# Patient Record
Sex: Female | Born: 1975 | Race: White | Hispanic: No | Marital: Married | Smoking: Never smoker
Health system: Southern US, Community
[De-identification: ages and names within clinical notes are randomized; demographics above are authoritative.]

## PROBLEM LIST (undated history)

## (undated) DIAGNOSIS — N189 Chronic kidney disease, unspecified: Secondary | ICD-10-CM

## (undated) HISTORY — DX: Chronic kidney disease, unspecified: N18.9

## (undated) HISTORY — PX: BREAST EXCISIONAL BIOPSY: SUR124

## (undated) HISTORY — PX: CERVICAL ABLATION: SHX5771

---

## 2000-03-28 ENCOUNTER — Ambulatory Visit (HOSPITAL_COMMUNITY): Admission: RE | Admit: 2000-03-28 | Discharge: 2000-03-28 | Payer: Self-pay | Admitting: General Surgery

## 2000-03-28 ENCOUNTER — Encounter (INDEPENDENT_AMBULATORY_CARE_PROVIDER_SITE_OTHER): Payer: Self-pay

## 2003-07-09 ENCOUNTER — Encounter: Payer: Self-pay | Admitting: Emergency Medicine

## 2003-07-09 ENCOUNTER — Emergency Department (HOSPITAL_COMMUNITY): Admission: EM | Admit: 2003-07-09 | Discharge: 2003-07-09 | Payer: Self-pay | Admitting: Emergency Medicine

## 2006-03-14 ENCOUNTER — Inpatient Hospital Stay (HOSPITAL_COMMUNITY): Admission: AD | Admit: 2006-03-14 | Discharge: 2006-03-17 | Payer: Self-pay | Admitting: Obstetrics and Gynecology

## 2006-03-14 ENCOUNTER — Encounter (INDEPENDENT_AMBULATORY_CARE_PROVIDER_SITE_OTHER): Payer: Self-pay | Admitting: Specialist

## 2006-04-29 ENCOUNTER — Other Ambulatory Visit: Admission: RE | Admit: 2006-04-29 | Discharge: 2006-04-29 | Payer: Self-pay | Admitting: Obstetrics and Gynecology

## 2008-07-10 ENCOUNTER — Inpatient Hospital Stay (HOSPITAL_COMMUNITY): Admission: AD | Admit: 2008-07-10 | Discharge: 2008-07-11 | Payer: Self-pay | Admitting: Obstetrics and Gynecology

## 2008-07-10 ENCOUNTER — Encounter (INDEPENDENT_AMBULATORY_CARE_PROVIDER_SITE_OTHER): Payer: Self-pay | Admitting: Obstetrics and Gynecology

## 2010-04-11 ENCOUNTER — Other Ambulatory Visit: Admission: RE | Admit: 2010-04-11 | Discharge: 2010-04-11 | Payer: Self-pay | Admitting: Family Medicine

## 2011-01-05 ENCOUNTER — Observation Stay (HOSPITAL_COMMUNITY)
Admission: AD | Admit: 2011-01-05 | Discharge: 2011-01-08 | Disposition: A | Discharge status: Home / Self Care | Payer: PRIVATE HEALTH INSURANCE | Source: Ambulatory Visit | Attending: Obstetrics and Gynecology | Admitting: Obstetrics and Gynecology

## 2011-01-05 DIAGNOSIS — D72829 Elevated white blood cell count, unspecified: Secondary | ICD-10-CM | POA: Insufficient documentation

## 2011-01-05 DIAGNOSIS — N949 Unspecified condition associated with female genital organs and menstrual cycle: Secondary | ICD-10-CM | POA: Insufficient documentation

## 2011-01-05 DIAGNOSIS — O368390 Maternal care for abnormalities of the fetal heart rate or rhythm, unspecified trimester, not applicable or unspecified: Secondary | ICD-10-CM | POA: Insufficient documentation

## 2011-01-05 DIAGNOSIS — R509 Fever, unspecified: Secondary | ICD-10-CM

## 2011-01-05 DIAGNOSIS — O99891 Other specified diseases and conditions complicating pregnancy: Principal | ICD-10-CM | POA: Insufficient documentation

## 2011-01-05 LAB — COMPREHENSIVE METABOLIC PANEL
Alkaline Phosphatase: 116 U/L (ref 39–117)
BUN: 9 mg/dL (ref 6–23)
Chloride: 103 mEq/L (ref 96–112)
GFR calc non Af Amer: >60 mL/min (ref >60)
Glucose, Bld: 127 mg/dL — ABNORMAL HIGH (ref 70–99)
Potassium: 3.5 mEq/L (ref 3.5–5.1)
Total Bilirubin: 1 mg/dL (ref 0.3–1.2)

## 2011-01-05 LAB — URINALYSIS, ROUTINE W REFLEX MICROSCOPIC
Ketones, ur: 40 mg/dL — AB
Protein, ur: 100 mg/dL — AB
Specific Gravity, Urine: >1.03 — ABNORMAL HIGH (ref 1.005–1.030)
Urine Glucose, Fasting: NEGATIVE mg/dL
pH: 5.5 (ref 5.0–8.0)

## 2011-01-05 LAB — URINE MICROSCOPIC-ADD ON

## 2011-01-06 ENCOUNTER — Inpatient Hospital Stay (HOSPITAL_COMMUNITY): Payer: PRIVATE HEALTH INSURANCE

## 2011-01-06 LAB — CBC
HCT: 26.6 % — ABNORMAL LOW (ref 36.0–46.0)
MCHC: 33.5 g/dL (ref 30.0–36.0)
MCV: 87.4 fL (ref 78.0–100.0)
MCV: 87.5 fL (ref 78.0–100.0)
Platelets: 175 10*3/uL (ref 150–400)
RDW: 13.5 % (ref 11.5–15.5)
WBC: 25.2 10*3/uL — ABNORMAL HIGH (ref 4.0–10.5)
WBC: 17.4 10*3/uL — ABNORMAL HIGH (ref 4.0–10.5)

## 2011-01-06 LAB — DIFFERENTIAL
Basophils Relative: 0 % (ref 0–1)
Blasts: 0 %
Eosinophils Relative: 0 % (ref 0–5)
Lymphocytes Relative: 1 % — ABNORMAL LOW (ref 12–46)
Lymphocytes Relative: 5 % — ABNORMAL LOW (ref 12–46)
Lymphs Abs: 0.3 10*3/uL — ABNORMAL LOW (ref 0.7–4.0)
Lymphs Abs: 0.8 10*3/uL (ref 0.7–4.0)
Metamyelocytes Relative: 0 %
Monocytes Absolute: 0.8 10*3/uL (ref 0.1–1.0)
Myelocytes: 0 %
Promyelocytes Absolute: 0 %
nRBC: 0 /100 WBC

## 2011-01-06 LAB — URINALYSIS, ROUTINE W REFLEX MICROSCOPIC
Leukocytes, UA: NEGATIVE
Protein, ur: NEGATIVE mg/dL
Urobilinogen, UA: 0.2 mg/dL (ref 0.0–1.0)

## 2011-01-06 LAB — URINE MICROSCOPIC-ADD ON

## 2011-01-07 LAB — CBC
HCT: 27.4 % — ABNORMAL LOW (ref 36.0–46.0)
MCH: 29.3 pg (ref 26.0–34.0)
MCHC: 33.2 g/dL (ref 30.0–36.0)
MCV: 88.1 fL (ref 78.0–100.0)
RDW: 13.9 % (ref 11.5–15.5)

## 2011-01-07 LAB — DIFFERENTIAL
Basophils Absolute: 0 10*3/uL (ref 0.0–0.1)
Eosinophils Relative: 0 % (ref 0–5)
Lymphocytes Relative: 7 % — ABNORMAL LOW (ref 12–46)
Monocytes Absolute: 0.8 10*3/uL (ref 0.1–1.0)
Monocytes Relative: 7 % (ref 3–12)

## 2011-01-07 LAB — URINE CULTURE

## 2011-01-08 ENCOUNTER — Inpatient Hospital Stay (HOSPITAL_COMMUNITY)
Admit: 2011-01-08 | Discharge: 2011-01-08 | Disposition: A | Discharge status: Home / Self Care | Payer: PRIVATE HEALTH INSURANCE | Attending: Obstetrics and Gynecology | Admitting: Obstetrics and Gynecology

## 2011-01-08 LAB — DIFFERENTIAL
Lymphocytes Relative: 16 % (ref 12–46)
Lymphs Abs: 1.3 10*3/uL (ref 0.7–4.0)
Monocytes Relative: 11 % (ref 3–12)
Neutro Abs: 6 10*3/uL (ref 1.7–7.7)
Neutrophils Relative %: 72 % (ref 43–77)

## 2011-01-08 LAB — CBC
Hemoglobin: 8.7 g/dL — ABNORMAL LOW (ref 12.0–15.0)
MCH: 29.1 pg (ref 26.0–34.0)
MCV: 88 fL (ref 78.0–100.0)
RBC: 2.99 MIL/uL — ABNORMAL LOW (ref 3.87–5.11)
WBC: 8.4 10*3/uL (ref 4.0–10.5)

## 2011-01-09 NOTE — Discharge Summary (Signed)
  NAMEANGELEAH, Phyllis [name]                  ACCOUNT NO.:  [id number]  MEDICAL RECORD NO.:  [id number]           PATIENT TYPE:  I  LOCATION:  9156                          [facility]ILITY:  [facility]  PHYSICIAN:  Malachi Pro. Ambrose Mantle, M.D. DATE OF BIRTH:  Mar 25, 1976  DATE OF ADMISSION:  01/05/2011 DATE OF DISCHARGE:  01/08/2011                              DISCHARGE SUMMARY   A 35 year old white female para 1-1-0-2, gravida 3 at 35 weeks and 3 days admitted with fever of unknown origin.  She did have an ultrasound to do an amnio, but no fluid was seen.  She had a fever to 103.2, I consulted with the Maternal Fetal Medicine at [facility] and they recommended treating her with gentamicin and ampicillin; and with that regimen, she became afebrile, has been afebrile now for 36 hours.  Her fetal tachycardia has resolved.  There is a reactive nonstress test. Her white count has gone from 25,000 to 108,000 and I have consulted again with Dr. Otho Perl in [facility] Fetal Medicine and he agrees to discharge with close followup.  She is to return in 4 days.  He does not want her discharged on antibiotics.  Because the patient did have a slight cough, a  chest x-ray was done and it was normal.  Cath urine was negative.  FINAL DIAGNOSES:  Intrauterine pregnancy at 35 weeks and 3 days with fever of unknown origin, leukocytosis, and pelvic pressure.  OPERATIONS PERFORMED:  None.  FINAL CONDITION:  Improved.  INSTRUCTIONS:  Calling with any fever above 100 degrees.  Call with any unusual problems.  Return to the office on Friday, which is January 12, 2011, for office visit and nonstress test.     Malachi Pro. Ambrose Mantle, M.D.     TFH/MEDQ  D:  01/08/2011  T:  01/08/2011  Job:  [id number]  Electronically Signed by Tracey Harries M.D. on 01/09/2011 09:10:47 AM

## 2011-01-09 NOTE — H&P (Signed)
Phyllis [name], Phyllis [name]                  ACCOUNT NO.:  [id number]  MEDICAL RECORD NO.:  [id number]           PATIENT TYPE:  I  LOCATION:  [facility]                         [facility]  PHYSICIAN:  Malachi Pro. Ambrose Mantle, M.D. DATE OF BIRTH:  August 09, 1976  DATE OF ADMISSION:  01/05/2011 DATE OF DISCHARGE:                             HISTORY & PHYSICAL   HISTORY OF PRESENT ILLNESS:  A 35 year old white female, para 1-1-0-2, who has a last period of May 03, 2010, and [facility] of February 07, 2011, by her last period.  Ultrasound on July 06, 2010, showed an estimated gestational age of [redacted] weeks and 6 days with an [facility] of February 09, 2011. Her official [facility] is February 07, 2011.  The patient had a relatively uncomplicated prenatal course.  Blood group and type O+ with a negative antibody, rubella immune, RPR nonreactive.  Urine culture show group B strep, hepatitis B surface antigen negative, HIV negative, GC and Chlamydia negative.  Cystic fibrosis was declined.  First trimester screening was declined.  1-hour Glucola was 127.  HIV and RPR repeats were negative.  The patient's 2 prior pregnancies resulted in delivery at 38 weeks and 4 days and 36 weeks.  The first pregnancy at 38 weeks and 4 days, the patient became febrile.  The etiology of the fever could not be determined and since there was a risk of chorioamnionitis with a favorable cervix, the patient was induced and had a healthy baby.  The pathology on the placenta showed no chorioamnionitis.  Her second delivery was at 36 weeks when she presented with almost the exact same clinical picture, fever of uncertain origin, but she did go into labor and her cervix changed from 2 cm to 4 cm and she was subsequently delivered of a living infant that spent time in the NICU with respiratory problems.  The patient's prenatal course this time was relatively uncomplicated until January 04, 2011, when she was in the office she was examined and subsequently she states  she had intense pelvic pressure that did not abate.  She was able to sleep through the night, but again awakening on January 05, 2011, she had pelvic pressure, became nauseated, vomited x1, had a temperature elevation to 100.1, came to the office and was examined.  A nonstress test was reactive.  Her cervix had not changed and she was sent home.  Later in the evening of January 05, 2011, she developed a fever to 103.2 and came to the Maternity Admission Unit for evaluation.  Cath urine showed no infection.  The urinalysis was negative.  She had no significant uterine contractions.  Her temperature was not as high as it had been at home.  The uterus was not tender.  White count was 25,000, otherwise her labs were reasonably normal.  PHYSICAL EXAMINATION:  GENERAL:  A well-developed, well-nourished white female, uncomfortable, whose temperature ranged from 100.5-99.1 and pulse rate from 156-127. HEAD, EYES, EARS, NOSE and THROAT:  Normal. LUNGS:  Clear to auscultation. HEART:  Normal size and sounds, but the rate is elevated.ABDOMEN:  Soft.  The fundal height is consistent  with dates and the uterus is not tender.  When her uterus is palpated, it does cause pressure in the pelvis, but no significant tenderness of the abdomen. The cervix is not reexamined since she is not contracting.  IMPRESSION:  Intrauterine pregnancy at 35 weeks and 3 days with leukocytosis and pelvic pressure.  I have spoken to Dr. Otho Perl from [facility] and he advised [place] an amniocentesis to look for signs of chorioamnionitis.  He also requests that we get a chest x-ray, which can be done after the amniocentesis.  The patient will be admitted to the hospital and await the results of the amniocentesis if they can be successfully accomplished.     Malachi Pro. Ambrose Mantle, M.D.     TFH/MEDQ  D:  01/06/2011  T:  01/06/2011  Job:  [id number]  Electronically Signed by Tracey Harries M.D.  on 01/09/2011 09:10:32 AM

## 2011-01-12 LAB — CULTURE, BLOOD (ROUTINE X 2)
Culture  Setup Time: 201202250357
Culture: NO GROWTH

## 2011-02-07 ENCOUNTER — Inpatient Hospital Stay (HOSPITAL_COMMUNITY): Admission: AD | Admit: 2011-02-07 | Payer: Self-pay | Source: Home / Self Care | Admitting: Obstetrics and Gynecology

## 2011-02-12 ENCOUNTER — Inpatient Hospital Stay (HOSPITAL_COMMUNITY)
Admission: RE | Admit: 2011-02-12 | Discharge: 2011-02-14 | DRG: 775 | Disposition: A | Discharge status: Home / Self Care | Payer: PRIVATE HEALTH INSURANCE | Source: Ambulatory Visit | Attending: Obstetrics and Gynecology | Admitting: Obstetrics and Gynecology

## 2011-02-12 DIAGNOSIS — Z22330 Carrier of Group B streptococcus: Secondary | ICD-10-CM

## 2011-02-12 DIAGNOSIS — O99892 Other specified diseases and conditions complicating childbirth: Secondary | ICD-10-CM | POA: Diagnosis present

## 2011-02-12 LAB — CBC
HCT: 33.3 % — ABNORMAL LOW (ref 36.0–46.0)
Hemoglobin: 10.7 g/dL — ABNORMAL LOW (ref 12.0–15.0)
MCHC: 32.1 g/dL (ref 30.0–36.0)
MCV: 86.9 fL (ref 78.0–100.0)
RDW: 14.5 % (ref 11.5–15.5)
WBC: 8.1 10*3/uL (ref 4.0–10.5)

## 2011-02-16 NOTE — Discharge Summary (Signed)
  [name], [name]                  ACCOUNT NO.:  [id number]  MEDICAL RECORD NO.:  [id number]           PATIENT TYPE:  I  LOCATION:  9120                          [facility]ACILITY:  [facility]  PHYSICIAN:  Zenaida Niece, M.D.DATE OF BIRTH:  1976-06-07  DATE OF ADMISSION:  02/12/2011 DATE OF DISCHARGE:  02/14/2011                              DISCHARGE SUMMARY   ADMISSION DIAGNOSES:  Intrauterine pregnancy at 40 weeks, group B strep carrier.  DISCHARGE DIAGNOSES:  Intrauterine pregnancy at 40 weeks, group B strep carrier.  PROCEDURES:  On April 2, she had a spontaneous vaginal delivery.  HISTORY AND PHYSICAL:  This is a 35 year old gravida 3, para 1-1-0-2 with an EGA of 40 plus weeks who presents for elective induction. Prenatal care complicated by the fact that she received weekly 17 progesterone shots for history of 36-week delivery and was admitted at 35 weeks for rule out labor/rule out chorioamnionitis.  SIGNIFICANT PRENATAL LABS:  Blood type is O+ with negative antibody screen, rubella immune, 1-hour Glucola 127, group B strep is positive.  PAST OB HISTORY:  In 2007 vaginal delivery at 38 weeks, 6 pounds 13 ounces complicated by possible chorioamnionitis or a fever of unknown origin and in 2009 vaginal delivery at 36 weeks, 6 pounds 9 ounces. Also had fever and tachycardia.  PAST SURGICAL HISTORY:  Right breast lumpectomy.  PHYSICAL EXAMINATION:  VITAL SIGNS:  She is afebrile with stable vital signs.  Fetal heart tracing category 1. ABDOMEN:  Gravid, nontender with an estimated fetal weight of 7 pounds. Cervix is 450, -2 vertex presentation.  HOSPITAL COURSE:  The patient was admitted and started on penicillin for group B strep prophylaxis.  She was also started on Pitocin induction. She entered active labor, had spontaneous rupture of membranes, received an epidural.  Progressed to complete, pushed well.  Late on the morning of April 2, she had a vaginal delivery of a viable  female infant with Apgar's of 8 and 9 that weighed 7 pounds 12 ounces.  Placenta delivered spontaneous, was intact after cord blood donation.  She had a second- degree laceration repaired with 3-0 Vicryl with local block and estimated blood loss was about 500 mL.  Postpartum, she had no significant complications.  She wanted to go home on postpartum day #1, but the pediatrician did not allow the baby to go home due to the group B strep status.  Thus on postpartum day #2, she was discharged home.  DISCHARGE INSTRUCTIONS:  Regular diet, pelvic rest, follow up in 6 weeks.  Medications, over-the-counter ibuprofen as needed and she was given our discharge pamphlet.     Zenaida Niece, M.D.     TDM/MEDQ  D:  02/14/2011  T:  02/14/2011  Job:  [id number]  Electronically Signed by Lavina Hamman M.D. on 02/16/2011 12:29:43 PM

## 2011-03-27 NOTE — Discharge Summary (Signed)
Phyllis [name], Phyllis [name]                  ACCOUNT NO.:  [id number]   MEDICAL RECORD NO.:  [id number]          PATIENT TYPE:  INP   LOCATION:  9121                          [facility]ILITY:  [facility]   PHYSICIAN:  Sherron Monday, MD        DATE OF BIRTH:  [birth year]   DATE OF ADMISSION:  07/10/2008  DATE OF DISCHARGE:  07/11/2008                               DISCHARGE SUMMARY   ADMITTING DIAGNOSES:  Intrauterine pregnancy at 36 weeks, nausea,  vomiting, and fever.   DISCHARGE DIAGNOSES:  Intrauterine pregnancy at 36 weeks, nausea,  vomiting, and fever, delivered via spontaneous vaginal delivery.   HISTORY OF PRESENT ILLNESS:  A 35 year old G2, P1-0-0-1 at 36 weeks with  nausea, vomiting, and fever, had some pressure and contractions.  She  states she has had good fetal movement, no loss of fluid, no vaginal  bleeding.  She does have a history of fever and questionable  chorioamnionitis with her first pregnancy and currently she denies any  sick contacts.  She woke up at 2 a.m. and has been vomiting since 3:30  a.m.  On admission, there was maternal fever to 101.5 and maternal  tachycardia to the 140s as well as fetal tachycardia to the 180s-200s.  There was cervical change from the orifice from 2 cm to 4 cm.  The  patient denies upper respiratory in symptoms and has had uncomplicated  prenatal carried at this point.   PAST MEDICAL HISTORY:  Nonsignificant.   PAST SURGICAL HISTORY:  Significant for past thrombectomy.   PAST OBSTETRICIAN-GYNECOLOGIST HISTORY:  Significant for G1 term vaginal  delivery, female infant, fever of unknown origin and questionable  chorioamnionitis.  G2 is the present pregnancy.  No history of an  abnormal Pap smears or sexually transmitted diseases.   MEDICATIONS:  Include prenatal vitamins.   ALLERGIES:  Include sea foods.   SOCIAL HISTORY:  The patient denies alcohol, tobacco, or drug use.  She  is married.   FAMILY HISTORY:  Significant for paternal grandfather with  coronary  artery disease, brother with OCD, history of thyroid disease, paternal  grandmother with ovarian cancer and breast cancer.  Father with prostate  cancer.   PRENATAL LABS:  Hemoglobin 12.6, platelets 213,000, O+, antibody screen  negative.  Pap smear within normal limits.  Gonorrhea negative.  Chlamydia negative.  RPR nonreactive.  Rubella immune.  Cystic fibrosis  screen declined.  Hepatitis C surface antigen negative.  HIV negative.  1-hour Glucola 116.  Group B strep positive in urine and early  pregnancy.   ULTRASOUND:  Performed on Mar 15, 2008, in 19 weeks revealed normal  anatomy and anterior placenta.   PHYSICAL EXAMINATION:  VITAL SIGNS:  On admission, temperature was 101,  T-max taken was 101.5, maternal pulse was 115-163, and the pulse ox was  97-98%.  GENERAL:  No apparent distress, however, uncomfortable.  Benign  exam with nontender fundus.  Fetal heart tones were in the 180s with  good variability and contractions every 2-4 minutes as well as the  vaginal exam of 4 cm dilated, 70% effaced, and -  2 station.  White count  of 20.3 with a left shift and 93% neutrophils.  Urinalysis was negative.  Chest x-ray showed mild bibasilar atelectasis.  Lipase and amylase were  within normal limits.  Sodium 134, potassium 3.2, chloride 108, CO2 19,  BUN 6, creatinine 0.5, glucose of 95, SGOT 24, SGPT 13.  The patient was  admitted in labor with nausea, vomiting, and fever.   LABORATORY WORKUP:  Chest x-ray and EKG were as above.  She was admitted  to labor and delivery, and her symptoms were monitored.  Her labor  progressed and her water was broken.  With the transfer to labor and  delivery, the patient was afebrile.  Fetal heart tones were in the 150s  with continued contractions.  Her labor was monitored closely.  She  progressed to 6, 80 and -1.  Her membranes were artificially ruptured  with moderate clear fluid.  She progressed to complete +2 and again had  some fetal  heart tachycardia to the 170s with moderate variability with  some variable decelerations.  She pushed well for approximately 10  minutes, delivered a viable female infant at 15:15 with Apgars of 8 at one  minute and 9 at five minutes, and weight of 6 pounds 7 ounces.  Placenta  was expressed intact at 16:01, sent for pathology secondary to maternal  tachycardia and fetal tachycardia.  Nuchal cord x1 is reduced for  delivery.  Second degree OB laceration repaired with 3-0 Vicryl in the  typical fashion.  EBL was less than 500 mL.  Maternal postpartum course  was relatively uncomplicated.  She remained afebrile aside for a T-max  of 100.5, immediately after delivery.  Vital signs were stable.  She was  discharged to home in 24 hours after delivery with routine discharge  instructions, numbers to call if any questions or any problems as well  as prescriptions for  Motrin and Vicodin, and prenatal vitamins in  postpartum course.  The infant was having trouble with grunting and went  to the NICU.  She plans to breastfeed.  She is O positive.  Rubella  immune.  Usually used condoms for contraception and her hemoglobin  decreased from 12.6 to 10.6.  She is going to monitor her temperature  closely at home and call with a temperature above 100.5.      Sherron Monday, MD  Electronically Signed     JB/MEDQ  D:  07/11/2008  T:  07/12/2008  Job:  [id number]

## 2011-03-30 NOTE — Op Note (Signed)
[place]  Patient:    Phyllis [name], Phyllis [name]                        MRN: [id number] Proc. Date: 03/28/00 Adm. Date:  [id number] Disc. Date: [id number] Attending:  Carson Myrtle                           Operative Report  PREOPERATIVE DIAGNOSIS:  Mass - right breast.  POSTOPERATIVE DIAGNOSIS:  Mass - right breast.  PROCEDURE:  Excision mass - right breast.  SURGEON:  Timothy E. Earlene Plater, M.D.  ANESTHESIA:  Local, standby.  INDICATIONS:  Ms. [name] is otherwise healthy 35 year old.  She has had a mass in the right breast, it has enlarged lately and caused some discomfort. Though we feel this is a safe nodule by age and ultrasound, because of family history and personal fear, we have certainly agreed to proceed with biopsy. She does have a palpable mass high in the upper/outer quadrant of the right breast.  DESCRIPTION OF PROCEDURE:  Patient was brought to the operating room, IV started, sedation given. The right breast was prepped and draped in the usual fashion.  Where she and I had located the mass to our satisfaction, the skin was anesthetized.  A short curvilinear incision made and the mass after a small amount of blunt dissection, literally popped into the field.  It appeared to be a lymph node.  It was completely dissected out and removed. The cautery was used for hemostasis and the wound was dry.  It was closed in layers with 5-0 Monocryl.  Steri-Strips applied, counts were correct, specimen to the laboratory in formalin.  She was removed to recovery room in good condition.  Written and verbal instructions were given to she and her parents and she will be followed as an outpatient. DD:  03/28/00 TD:  04/01/00 Job: [id number]

## 2011-03-30 NOTE — Discharge Summary (Signed)
Phyllis [name], Phyllis [name]                  ACCOUNT NO.:  [id number]   MEDICAL RECORD NO.:  [id number]          PATIENT TYPE:  INP   LOCATION:  9145                          [facility]   PHYSICIAN:  Malachi Pro. Ambrose Mantle, M.D. DATE OF BIRTH:  [birth year]   DATE OF ADMISSION:  03/14/2006  DATE OF DISCHARGE:  03/17/2006                                 DISCHARGE SUMMARY   HISTORY:  35 year old white female, para 0, gravida 1, 38 weeks and 4 days  by last period, compatible with a 7-week ultrasound with [facility] Mar 26, 2006,  presented after evaluation in the office for contractions, pressure and  noted to have temperature elevation of 102.3 degrees and fetal tachycardia  with favorable cervix.  Prenatal course essentially uncomplicated.  The  patient was treated with Keflex for UTI at 33 weeks.  Her blood group and  type was O+, negative antibody, toxoplasmosis negative, RPR nonreactive,  rubella immune, hepatitis B surface antigen negative, GC and chlamydia  negative.  A 1-hour Glucola 106.  Group B strep positive in the urine.   PAST MEDICAL HISTORY:  Negative.   SURGICAL HISTORY:  Right breast lumpectomy.   ALLERGIES:  NO KNOWN DRUG ALLERGIES.   MEDICATIONS:  No medications.   The patient was afebrile on admission although she had had a fever.  Fetal  heart tones were reactive.  The abdomen was soft, fundal height was  considered appropriate for 7 pounds gestation, cervix was 3-4 cm, 80%.  Artificial rupture of membranes produced clear fluid.  The lungs and heart  were normal.  The patient's was considered to have a low grade temperature  and fetal tachycardia, both currently resolved with a favorable cervix.  The  patient was placed on Pitocin.  Artificial rupture of membranes was done and  Unasyn was begun to cover broad spectrum for possible chorioamnionitis.  The  patient received an epidural.  Her heart rate of the baby was good.  She  reached full dilatation at 9:05 p.m.  She did deliver  spontaneously a 6  pound 13 ounce infant, female infant, Apgars of 8 and 9 at one and five  minutes.  There was a loose nuchal cord times one that was reduced.  Placenta was spontaneous and intact.  Cord blood collection was done.  Irregular second-degree laceration and right labial laceration repaired with  3-0 and 2-0 Vicryl with local block.  Unasyn was continued.  Temperature  maximum went to 101.4 degrees.  Urine culture was negative.  On the third  postpartum day the patient had been afebrile for 48 hours and was  discharged.  Urine culture showed no growth.  Initial hemoglobin 12.5,  hematocrit 36.4, white count 21,400, platelet count 177,000.  Follow-up  hemoglobin 10.4, white count 16,600.  Urinalysis was negative.  RPR was  nonreactive.   FINAL DIAGNOSES:  1.  Intrauterine pregnancy at 38+ weeks, delivered vertex.  2.  Fever of uncertain etiology, presumed chorioamnionitis.   OPERATIONS:  1.  Spontaneous delivery, vertex.  2.  Repair of vaginal lacerations.   FINAL CONDITION:  Improved.  Instructions include our regular discharge  instruction booklet.  The patient is advised to return to the office in 6  weeks for follow-up examination.  She is asked to call with any fever above  100.4 degrees.  Call with any problems.      Malachi Pro. Ambrose Mantle, M.D.  Electronically Signed     TFH/MEDQ  D:  03/17/2006  T:  03/18/2006  Job:  [id number]

## 2017-02-15 ENCOUNTER — Other Ambulatory Visit: Payer: Self-pay | Admitting: Obstetrics and Gynecology

## 2017-02-15 DIAGNOSIS — N6320 Unspecified lump in the left breast, unspecified quadrant: Secondary | ICD-10-CM

## 2017-02-19 ENCOUNTER — Ambulatory Visit
Admission: RE | Admit: 2017-02-19 | Discharge: 2017-02-19 | Disposition: A | Discharge status: Home / Self Care | Payer: Commercial Managed Care - PPO | Source: Ambulatory Visit | Attending: Obstetrics and Gynecology | Admitting: Obstetrics and Gynecology

## 2017-02-19 DIAGNOSIS — N6320 Unspecified lump in the left breast, unspecified quadrant: Secondary | ICD-10-CM

## 2017-02-20 ENCOUNTER — Other Ambulatory Visit: Payer: PRIVATE HEALTH INSURANCE

## 2017-07-16 ENCOUNTER — Other Ambulatory Visit: Payer: Self-pay | Admitting: Obstetrics and Gynecology

## 2017-07-16 DIAGNOSIS — N63 Unspecified lump in unspecified breast: Secondary | ICD-10-CM

## 2017-08-27 ENCOUNTER — Other Ambulatory Visit: Payer: Commercial Managed Care - PPO

## 2017-09-03 ENCOUNTER — Other Ambulatory Visit: Payer: Self-pay | Admitting: Obstetrics and Gynecology

## 2017-09-03 ENCOUNTER — Ambulatory Visit
Admission: RE | Admit: 2017-09-03 | Discharge: 2017-09-03 | Disposition: A | Discharge status: Home / Self Care | Payer: Commercial Managed Care - PPO | Source: Ambulatory Visit | Attending: Obstetrics and Gynecology | Admitting: Obstetrics and Gynecology

## 2017-09-03 DIAGNOSIS — N63 Unspecified lump in unspecified breast: Secondary | ICD-10-CM

## 2018-03-28 ENCOUNTER — Other Ambulatory Visit: Payer: Self-pay | Admitting: Urology

## 2018-04-13 NOTE — H&P (Signed)
HPI: Phyllis [name] is a 42 year-old female who has been experiencing gross hematuria.  She has a known lesion at the dome of her bladder and a known left renal calculus.  The problem is on the left side. She has had no symptoms. She has not caught a stone in her urine strainer since her symptoms began.   She has never had surgical treatment for calculi in the past. This condition would be considered of mild to moderate severity with no modifying factors or associated signs or symptoms other than as noted above.   07/24/17: She reports that she has had gross hematuria 2 times in the past the first was in 2/18 and at that time a urine culture was performed and found to be negative. She was having no symptoms either irritative voiding symptoms or pain at that time. She had a second episode of gross hematuria in 7/18 again not associated with any voiding symptoms or pain but she said she felt excessively tired. She has now redeveloped gross hematuria once again and remains completely asymptomatic. There is a family history of kidney stones in both her father and her brother. She has never smoked nor worked around any form of Personnel officer and has no family history of GU malignancy or renal disease.   08/08/17: At her last visit I obtained a CT scan that revealed nonobstructing stones in the lower pole of her left kidney with no right renal calculi. A contrasted CT scan revealed no abnormality of the upper tract but there did appear to be a small nodule associated with the anterior wall of the bladder.   01/27/18: She returns today for follow-up of her left renal calculi as well as repeat cystoscopy to evaluate her bladder where an area of concern was found at the dome. She has a history of hematuria. She has not had any gross hematuria, flank pain or stone passage. She is asymptomatic currently other than having some increased nocturia.     CC: I have blood in my urine.  HPI: She first noticed the  symptoms 03/24/2018. She did see the blood in her urine. She has not seen blood clots.   She does have a burning sensation when she urinates. She is not currently having trouble urinating.   She has had kidney stones. She is having pain. She has not recently had unwanted weight loss.   Her last U/S or CT Scan was 01/27/2018.   03/25/18: She came in today because she began experiencing gross hematuria yesterday. It was associated with malaise. She also does said she has random pains and she describes the location of the pain in the area of the urethra. It does not occur with voiding. She has a pressure-type sensation which results in increased urinary frequency. She does not have any flank pain. She has a known left renal calculus. She observe that this morning when she exercised the blood increased.     ALLERGIES: Seafood    MEDICATIONS: None   GU PSH: Cystoscopy - 01/27/2018, 08/08/2017 Locm 300-399Mg /Ml Iodine,1Ml - 07/24/2017    NON-GU PSH: None   GU PMH: Renal calculus (Stable), Left, Her left renal calculus remains stable. She has not demonstrated any evidence of metabolic stone activity. I discussed with her the symptoms of kidney stone passage which would include flank pain, hematuria or new irritative voiding symptoms. If these occurs she will return to see me otherwise I am not going to continue to image her. 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 - 08/08/2017 Gross hematuria (Stable), I found no abnormality of the upper tract on her CT scan to account for her hematuria. Cystoscopically I noted that the  nodule noted on her CT scan appears to be associated with the dome of her bladder consistent with some form of urachal anomaly. She is asymptomatic. - 08/08/2017, I discussed with her the fact that she has left renal calculi but she is not passing any stones. This doesn't mean she may not have just recently passed one however with gross hematuria and no obvious source seen on her noncontrast study she needs a contrasted CT to evaluate her urothelium. I will then have her return after completing this study for completion of her workup of gross hematuria with cystoscopy., - 07/24/2017    NON-GU PMH: None   FAMILY HISTORY: Prostate Cancer - Father   SOCIAL HISTORY: Marital Status: Married Preferred Language: English; Ethnicity: Not Hispanic Or Latino; Race: White Current Smoking Status: Patient has never smoked.   Tobacco Use Assessment Completed: Used Tobacco in last 30 days? Has never drank.  Drinks 1 caffeinated drink per day.    REVIEW OF SYSTEMS:    GU Review Female:   Patient reports frequent urination. Patient denies hard to postpone urination, being pregnant, stream starts and stops, leakage of urine, have to strain to urinate, trouble starting your stream, get up at night to urinate, and burning /pain with urination.  Gastrointestinal (Upper):   Patient denies nausea, vomiting, and indigestion/ heartburn.  Gastrointestinal (Lower):   Patient denies diarrhea and constipation.  Constitutional:   Patient denies fever, night sweats, weight loss, and fatigue.  Skin:   Patient denies skin rash/ lesion and itching.  Eyes:   Patient denies blurred vision and double vision.  Ears/ Nose/ Throat:   Patient denies sore throat and sinus problems.  Hematologic/Lymphatic:   Patient denies swollen glands and easy bruising.  Cardiovascular:   Patient denies leg swelling and chest pains.  Respiratory:   Patient denies cough and shortness of breath.  Endocrine:   Patient denies excessive thirst.   Musculoskeletal:   Patient denies back pain and joint pain.  Neurological:   Patient denies headaches and dizziness.  Psychologic:   Patient denies depression and anxiety.   VITAL SIGNS:    Weight 148 lb / 67.13 kg  Height 66 in / 167.64 cm  BP 111/70 mmHg  Pulse 70 /min  BMI 23.9 kg/m   MULTI-SYSTEM PHYSICAL EXAMINATION:    Constitutional: Well-nourished. No physical deformities. Normally developed. Good grooming.  Neck: Neck symmetrical, not swollen. Normal tracheal position.  Respiratory: No labored breathing, no use of accessory muscles.   Cardiovascular: Normal temperature, normal extremity pulses, no swelling, no varicosities.  Lymphatic: No enlargement of neck, axillae, groin.  Skin: No paleness, no jaundice, no cyanosis. No lesion, no ulcer, no rash.  Neurologic / Psychiatric: Oriented to time, oriented to place, oriented to person. No depression, no anxiety, no agitation.  Gastrointestinal: No mass, no tenderness, no rigidity, non obese abdomen.  External Genitalia: No hirsutism, no rash, no scarring, no cyst, no erythematous lesion,  no papular lesion, no blanched lesion, no warty lesion. No edema.  Urethral Meatus: Normal size. Normal position. No discharge.  Urethra: No tenderness, no mass, no scarring. No hypermobility. No leakage.  Bladder: Normal to palpation, no tenderness, no mass, normal size.  Eyes: Normal conjunctivae. Normal eyelids.  Ears, Nose, Mouth, and Throat: Left ear no scars, no lesions, no masses. Right ear no scars, no lesions, no masses. Nose no scars, no lesions, no masses. Normal hearing. Normal lips.  Musculoskeletal: Normal gait and station of head and neck.     PAST DATA REVIEWED:  Source Of History:  Patient  Records Review:   Previous Patient Records, POC Tool   PROCEDURES:         KUB - 74018  A single view of the abdomen is obtained.      I obtained a KUB and the images were independently reviewed. She has a linear stone in the lower  pole of her left kidney which appears unchanged as well as a punctate stone in the midportion that also is still present in the left kidney with no stones/calcifications along the course of the ureter.         Urinalysis Dipstick Dipstick Cont'd Micro  Color: Red Bilirubin: Invalid WBC/hpf: NS (Not Seen)  Appearance: Turbid Ketones: Invalid RBC/hpf: >60/hpf  Specific Gravity: Invalid Blood: Invalid Bacteria: Rare (0-9/hpf)  pH: Invalid Protein: Invalid Cystals: NS (Not Seen)  Glucose: Invalid Urobilinogen: Invalid Casts: NS (Not Seen)    Nitrites: Invalid Trichomonas: Not Present    Leukocyte Esterase: Invalid Mucous: Not Present      Epithelial Cells: 0 - 5/hpf      Yeast: NS (Not Seen)      Sperm: Not Present    Notes: Microscopic done on unconcentrated urine    ASSESSMENT/PLAN:      ICD-10 Details  1 GU:   Gross hematuria - R31.0 She is having gross hematuria. She did have this area at the dome of her bladder noted cystoscopically and I did not think that there was any likelihood it would cause any bleeding but she is having bleeding now and we discussed evaluating this further under anesthesia. I will plan to perform retrograde pyelograms and also resect the lesion at the dome of her bladder.  2   Renal calculus - N20.0 Stable - Her left renal calculi are stable. Her hematuria is almost surely not from her stones.  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

## 2018-04-13 NOTE — Discharge Instructions (Addendum)

## 2018-04-14 ENCOUNTER — Ambulatory Visit: Admit: 2018-04-14 | Payer: 59 | Admitting: Urology

## 2018-04-14 ENCOUNTER — Encounter (HOSPITAL_COMMUNITY): Payer: Self-pay | Admitting: *Deleted

## 2018-04-14 ENCOUNTER — Encounter (HOSPITAL_COMMUNITY): Payer: Self-pay | Admitting: Anesthesiology

## 2018-04-14 ENCOUNTER — Other Ambulatory Visit: Payer: Self-pay

## 2018-04-14 ENCOUNTER — Encounter (HOSPITAL_COMMUNITY)
Admission: RE | Disposition: A | Discharge status: Home / Self Care | Payer: Self-pay | Source: Ambulatory Visit | Attending: Urology

## 2018-04-14 ENCOUNTER — Ambulatory Visit (HOSPITAL_COMMUNITY): Payer: 59

## 2018-04-14 ENCOUNTER — Ambulatory Visit: Admit: 2018-04-14 | Payer: Commercial Managed Care - PPO | Admitting: Urology

## 2018-04-14 ENCOUNTER — Ambulatory Visit (HOSPITAL_COMMUNITY): Payer: 59 | Admitting: Anesthesiology

## 2018-04-14 ENCOUNTER — Ambulatory Visit (HOSPITAL_BASED_OUTPATIENT_CLINIC_OR_DEPARTMENT_OTHER)
Admission: RE | Admit: 2018-04-14 | Discharge: 2018-04-14 | Disposition: A | Discharge status: Home / Self Care | Payer: 59 | Source: Ambulatory Visit | Attending: Urology | Admitting: Urology

## 2018-04-14 DIAGNOSIS — Z87442 Personal history of urinary calculi: Secondary | ICD-10-CM | POA: Insufficient documentation

## 2018-04-14 DIAGNOSIS — R948 Abnormal results of function studies of other organs and systems: Secondary | ICD-10-CM | POA: Diagnosis present

## 2018-04-14 DIAGNOSIS — R310 Gross hematuria: Secondary | ICD-10-CM | POA: Diagnosis not present

## 2018-04-14 DIAGNOSIS — N200 Calculus of kidney: Secondary | ICD-10-CM | POA: Insufficient documentation

## 2018-04-14 HISTORY — PX: CYSTOSCOPY W/ RETROGRADES: SHX1426

## 2018-04-14 LAB — PREGNANCY, URINE: PREG TEST UR: NEGATIVE

## 2018-04-14 SURGERY — CYSTOSCOPY, WITH RETROGRADE PYELOGRAM
Anesthesia: General

## 2018-04-14 MED ORDER — DEXAMETHASONE SODIUM PHOSPHATE 10 MG/ML IJ SOLN
INTRAMUSCULAR | Status: AC
  Filled 2018-04-14: qty 1

## 2018-04-14 MED ORDER — OXYCODONE HCL 5 MG/5ML PO SOLN: 5.0000 mg | Freq: Once | ORAL | Status: DC | PRN

## 2018-04-14 MED ORDER — CIPROFLOXACIN IN D5W 200 MG/100ML IV SOLN
200.0000 mg | Freq: Two times a day (BID) | INTRAVENOUS | Status: DC
  Administered 2018-04-14: 200 mg via INTRAVENOUS

## 2018-04-14 MED ORDER — LIDOCAINE 2% (20 MG/ML) 5 ML SYRINGE
INTRAMUSCULAR | Status: AC
  Filled 2018-04-14: qty 5

## 2018-04-14 MED ORDER — 0.9 % SODIUM CHLORIDE (POUR BTL) OPTIME
TOPICAL | Status: DC | PRN
  Administered 2018-04-14: 1000 mL

## 2018-04-14 MED ORDER — LACTATED RINGERS IV SOLN
INTRAVENOUS | Status: DC
  Administered 2018-04-14: 09:00:00 via INTRAVENOUS

## 2018-04-14 MED ORDER — PROPOFOL 10 MG/ML IV BOLUS
INTRAVENOUS | Status: AC
  Filled 2018-04-14: qty 20

## 2018-04-14 MED ORDER — ONDANSETRON HCL 4 MG/2ML IJ SOLN
INTRAMUSCULAR | Status: AC
  Filled 2018-04-14: qty 2

## 2018-04-14 MED ORDER — FENTANYL CITRATE (PF) 100 MCG/2ML IJ SOLN: 25.0000 ug | INTRAMUSCULAR | Status: DC | PRN

## 2018-04-14 MED ORDER — ONDANSETRON HCL 4 MG/2ML IJ SOLN: 4.0000 mg | Freq: Once | INTRAMUSCULAR | Status: DC | PRN

## 2018-04-14 MED ORDER — SODIUM CHLORIDE 0.9 % IV SOLN
INTRAVENOUS | Status: DC | PRN
  Administered 2018-04-14: 30 mL

## 2018-04-14 MED ORDER — LIDOCAINE 2% (20 MG/ML) 5 ML SYRINGE
INTRAMUSCULAR | Status: DC | PRN
  Administered 2018-04-14: 100 mg via INTRAVENOUS

## 2018-04-14 MED ORDER — MEPERIDINE HCL 50 MG/ML IJ SOLN: 6.2500 mg | INTRAMUSCULAR | Status: DC | PRN

## 2018-04-14 MED ORDER — OXYCODONE HCL 5 MG PO TABS: 5.0000 mg | ORAL_TABLET | Freq: Once | ORAL | Status: DC | PRN

## 2018-04-14 MED ORDER — FENTANYL CITRATE (PF) 100 MCG/2ML IJ SOLN
INTRAMUSCULAR | Status: AC
  Filled 2018-04-14: qty 2

## 2018-04-14 MED ORDER — ACETAMINOPHEN 325 MG PO TABS: 325.0000 mg | ORAL_TABLET | ORAL | Status: DC | PRN

## 2018-04-14 MED ORDER — CIPROFLOXACIN IN D5W 400 MG/200ML IV SOLN: 400.0000 mg | INTRAVENOUS | Status: DC

## 2018-04-14 MED ORDER — STERILE WATER FOR IRRIGATION IR SOLN
Status: DC | PRN
  Administered 2018-04-14: 500 mL

## 2018-04-14 MED ORDER — ACETAMINOPHEN 160 MG/5ML PO SOLN: 325.0000 mg | ORAL | Status: DC | PRN

## 2018-04-14 MED ORDER — MIDAZOLAM HCL 2 MG/2ML IJ SOLN
INTRAMUSCULAR | Status: DC | PRN
  Administered 2018-04-14: 2 mg via INTRAVENOUS

## 2018-04-14 MED ORDER — FENTANYL CITRATE (PF) 100 MCG/2ML IJ SOLN
INTRAMUSCULAR | Status: DC | PRN
  Administered 2018-04-14 (×4): 25 ug via INTRAVENOUS

## 2018-04-14 MED ORDER — CIPROFLOXACIN IN D5W 200 MG/100ML IV SOLN
200.0000 mg | Freq: Once | INTRAVENOUS | Status: DC
  Filled 2018-04-14: qty 100

## 2018-04-14 MED ORDER — SODIUM CHLORIDE 0.9 % IR SOLN
Status: DC | PRN
  Administered 2018-04-14: 6000 mL

## 2018-04-14 MED ORDER — PROPOFOL 10 MG/ML IV BOLUS
INTRAVENOUS | Status: DC | PRN
  Administered 2018-04-14: 150 mg via INTRAVENOUS

## 2018-04-14 MED ORDER — ONDANSETRON HCL 4 MG/2ML IJ SOLN
INTRAMUSCULAR | Status: DC | PRN
  Administered 2018-04-14: 4 mg via INTRAVENOUS

## 2018-04-14 MED ORDER — MIDAZOLAM HCL 2 MG/2ML IJ SOLN
INTRAMUSCULAR | Status: AC
  Filled 2018-04-14: qty 2

## 2018-04-14 MED ORDER — DEXAMETHASONE SODIUM PHOSPHATE 10 MG/ML IJ SOLN
INTRAMUSCULAR | Status: DC | PRN
  Administered 2018-04-14: 10 mg via INTRAVENOUS

## 2018-04-14 SURGICAL SUPPLY — 43 items
BAG DRAIN URO-CYSTO SKYTR STRL (DRAIN) IMPLANT
BAG URINE DRAINAGE (UROLOGICAL SUPPLIES) IMPLANT
BAG URINE LEG 19OZ MD ST LTX (BAG) IMPLANT
BASKET ZERO TIP NITINOL 2.4FR (BASKET) IMPLANT
CATH FOLEY 2WAY SLVR  5CC 20FR (CATHETERS)
CATH FOLEY 2WAY SLVR  5CC 22FR (CATHETERS)
CATH FOLEY 2WAY SLVR  5CC 24FR (CATHETERS)
CATH FOLEY 2WAY SLVR 5CC 20FR (CATHETERS) IMPLANT
CATH FOLEY 2WAY SLVR 5CC 22FR (CATHETERS) IMPLANT
CATH FOLEY 2WAY SLVR 5CC 24FR (CATHETERS) IMPLANT
CATH FOLEY 3WAY 20FR (CATHETERS) IMPLANT
CATH INTERMIT  6FR 70CM (CATHETERS) IMPLANT
CATH URET 5FR 28IN CONE TIP (BALLOONS)
CATH URET 5FR 70CM CONE TIP (BALLOONS) IMPLANT
CLOTH BEACON ORANGE TIMEOUT ST (SAFETY) IMPLANT
ELECT BIVAP BIPO 22/24 DONUT (ELECTROSURGICAL)
ELECT REM PT RETURN 9FT ADLT (ELECTROSURGICAL)
ELECTRD BIVAP BIPO 22/24 DONUT (ELECTROSURGICAL) IMPLANT
ELECTRODE REM PT RTRN 9FT ADLT (ELECTROSURGICAL) IMPLANT
EVACUATOR MICROVAS BLADDER (UROLOGICAL SUPPLIES) IMPLANT
EXTRACTOR STONE 1.7FRX115CM (UROLOGICAL SUPPLIES) IMPLANT
FIBER LASER FLEXIVA 365 (UROLOGICAL SUPPLIES) IMPLANT
FIBER LASER TRAC TIP (UROLOGICAL SUPPLIES) IMPLANT
GLOVE BIO SURGEON STRL SZ8 (GLOVE) IMPLANT
GOWN STRL REUS W/ TWL LRG LVL3 (GOWN DISPOSABLE) IMPLANT
GOWN STRL REUS W/ TWL XL LVL3 (GOWN DISPOSABLE) IMPLANT
GOWN STRL REUS W/TWL LRG LVL3 (GOWN DISPOSABLE)
GOWN STRL REUS W/TWL XL LVL3 (GOWN DISPOSABLE) IMPLANT
GUIDEWIRE 0.038 PTFE COATED (WIRE) IMPLANT
GUIDEWIRE ANG ZIPWIRE 038X150 (WIRE) IMPLANT
GUIDEWIRE STR DUAL SENSOR (WIRE) IMPLANT
HOLDER FOLEY CATH W/STRAP (MISCELLANEOUS) IMPLANT
INFUSOR MANOMETER BAG 3000ML (MISCELLANEOUS) IMPLANT
IV NS IRRIG 3000ML ARTHROMATIC (IV SOLUTION) IMPLANT
KIT TURNOVER CYSTO (KITS) IMPLANT
LOOP CUT BIPOLAR 24F LRG (ELECTROSURGICAL) IMPLANT
MANIFOLD NEPTUNE II (INSTRUMENTS) IMPLANT
NS IRRIG 500ML POUR BTL (IV SOLUTION) IMPLANT
PACK CYSTO (CUSTOM PROCEDURE TRAY) IMPLANT
PLUG CATH AND CAP STER (CATHETERS) IMPLANT
TUBE CONNECTING 12X1/4 (SUCTIONS) IMPLANT
TUBING UROLOGY SET (TUBING) IMPLANT
WATER STERILE IRR 3000ML UROMA (IV SOLUTION) IMPLANT

## 2018-04-14 SURGICAL SUPPLY — 29 items
BAG URINE DRAINAGE (UROLOGICAL SUPPLIES) ×3 IMPLANT
BAG URO CATCHER STRL LF (MISCELLANEOUS) ×3 IMPLANT
BASKET ZERO TIP 1.9FR (BASKET) ×3 IMPLANT
BASKET ZERO TIP NITINOL 2.4FR (BASKET) IMPLANT
CATH INTERMIT  6FR 70CM (CATHETERS) ×3 IMPLANT
CATH URET WHISTLE 5FR 28IN (CATHETERS) ×3 IMPLANT
CLOTH BEACON ORANGE TIMEOUT ST (SAFETY) ×3 IMPLANT
COVER FOOTSWITCH UNIV (MISCELLANEOUS) IMPLANT
COVER SURGICAL LIGHT HANDLE (MISCELLANEOUS) ×3 IMPLANT
ELECT REM PT RETURN 15FT ADLT (MISCELLANEOUS) ×3 IMPLANT
EVACUATOR MICROVAS BLADDER (UROLOGICAL SUPPLIES) ×3 IMPLANT
EXTRACTOR STONE 1.7FRX115CM (UROLOGICAL SUPPLIES) IMPLANT
GLOVE BIO SURGEON STRL SZ 6.5 (GLOVE) ×3 IMPLANT
GLOVE BIOGEL M 8.0 STRL (GLOVE) ×3 IMPLANT
GOWN STRL REUS W/ TWL LRG LVL3 (GOWN DISPOSABLE) ×2 IMPLANT
GOWN STRL REUS W/TWL LRG LVL3 (GOWN DISPOSABLE) ×1
GOWN STRL REUS W/TWL XL LVL3 (GOWN DISPOSABLE) ×3 IMPLANT
GUIDEWIRE ANG ZIPWIRE 038X150 (WIRE) IMPLANT
GUIDEWIRE STR DUAL SENSOR (WIRE) ×3 IMPLANT
HOLDER FOLEY CATH W/STRAP (MISCELLANEOUS) IMPLANT
LOOP CUT BIPOLAR 24F LRG (ELECTROSURGICAL) ×3 IMPLANT
MANIFOLD NEPTUNE II (INSTRUMENTS) ×3 IMPLANT
PACK CYSTO (CUSTOM PROCEDURE TRAY) ×3 IMPLANT
SET ASPIRATION TUBING (TUBING) ×3 IMPLANT
SHEATH URETERAL 12FRX28CM (UROLOGICAL SUPPLIES) IMPLANT
SHEATH URETERAL 12FRX35CM (MISCELLANEOUS) IMPLANT
SYRINGE IRR TOOMEY STRL 70CC (SYRINGE) IMPLANT
TUBING CONNECTING 10 (TUBING) ×3 IMPLANT
TUBING UROLOGY SET (TUBING) ×3 IMPLANT

## 2018-04-14 NOTE — Anesthesia Procedure Notes (Signed)
Procedure Name: LMA Insertion Date/Time: 04/14/2018 9:31 AM Performed by: Delphia Grateshandler, Heinrich Fertig, CRNA Pre-anesthesia Checklist: Emergency Drugs available, Suction available, Patient identified and Patient being monitored Patient Re-evaluated:Patient Re-evaluated prior to induction Oxygen Delivery Method: Circle system utilized Preoxygenation: Pre-oxygenation with 100% oxygen Induction Type: IV induction Ventilation: Mask ventilation without difficulty LMA: LMA inserted LMA Size: 4.0 Number of attempts: 1 Placement Confirmation: positive ETCO2 and breath sounds checked- equal and bilateral Tube secured with: Tape Dental Injury: Teeth and Oropharynx as per pre-operative assessment

## 2018-04-14 NOTE — Anesthesia Postprocedure Evaluation (Signed)
Anesthesia Post Note  Patient: Lilie C Adames  Procedure(s) Performed: CYSTOSCOPY WITH RETROGRADE PYELOGRAM (Bilateral )     Patient location during evaluation: PACU Anesthesia Type: General Level of consciousness: awake and alert Pain management: pain level controlled Vital Signs Assessment: post-procedure vital signs reviewed and stable Respiratory status: spontaneous breathing, nonlabored ventilation, respiratory function stable and patient connected to nasal cannula oxygen Cardiovascular status: blood pressure returned to baseline and stable Postop Assessment: no apparent nausea or vomiting Anesthetic complications: no    Last Vitals:  Vitals:   04/14/18 1030 04/14/18 1100  BP: 104/65 103/70  Pulse: 67 72  Resp: 13 14  Temp: 36.9 C 36.8 C  SpO2: 99% 100%    Last Pain:  Vitals:   04/14/18 1100  TempSrc:   PainSc: 0-No pain                 Parish Dubose

## 2018-04-14 NOTE — Op Note (Signed)
PATIENT:  Phyllis [name]  PRE-OPERATIVE DIAGNOSIS: 1.  Gross hematuria 2.  History of dome lesion  POST-OPERATIVE DIAGNOSIS: Same  PROCEDURE: Cystoscopy with bilateral retrograde pyelograms including interpretation  SURGEON:  Garnett FarmMark C Vada Yellen  INDICATION: Phyllis [name] is a 42 year old female who had been evaluated previously for gross hematuria in 9/18.  A CT scan at that time revealed left lower pole renal calculi I with what appeared to be a small nodule associated with the anterior wall of the bladder.  Cystoscopically I found normal-appearing mucosa overlying a slight nodularity at the dome of the bladder consistent with the most likely location of the urachus.  Since it appeared entirely benign I did not recommend any form of therapy.  She returned having experienced gross hematuria last month as well as occasional pain in the area of the urethra and a pressure type sensation associated with urinary frequency.  We therefore discussed proceeding with further evaluation of her upper tract bilaterally with contrasted retrograde pyelography and also resection of the lesion previously noted.  ANESTHESIA:  General  EBL:  Minimal  DRAINS: None  LOCAL MEDICATIONS USED:  None  SPECIMEN:    None  Description of procedure: After informed consent the patient was taken to the operating room and placed on the table in a supine position. General anesthesia was then administered. Once fully anesthetized the patient was moved to the dorsal lithotomy position and the genitalia were sterilely prepped and draped in standard fashion. An official timeout was then performed.  I passed the 23 French cystoscope with 30 degree lens into the bladder.  I then fully and systematically inspected the entire bladder.  I noted the right and left ureteral orifice were in normal position bilaterally however the orifice on each side was noted to be slightly small.  The area at the dome that had previously been visualized was  no longer present.  The mucosa was noted to be entirely normal and smooth.  I emptied the bladder to make sure it was not being compressed and still noted no lesion or nodularity that had been previously seen.  I used a 70 degree lens as well and again noted no abnormality.  I used a 6 [place] whistle-tip ureteral catheter to cannulate the right ureteral orifice and then injected full-strength Omnipaque contrast up the right ureter under direct fluoroscopy.  This revealed a delicate, smooth ureter with no filling defects, mass-effect or other abnormality.  It was visualized through its entire length.  The intrarenal collecting system was noted to be normal in appearance.  There were no filling defects or other abnormalities.  I then turned my attention to the left ureteral orifice and a left retrograde pyelogram was performed in an identical fashion.  It also revealed a normal-appearing delicate left ureter with no abnormality and a normal intrarenal collecting system.   the bladder was then drained and the cystoscope was removed.  I performed bimanual examination under anesthesia and was unable to palpate any mass or nodularity in the anticipated location previously seen on CT scan and noted cystoscopically.  The patient was therefore awakened and taken to the recovery room in stable and satisfactory condition.  She tolerated procedure well with no intraoperative complications.  PLAN OF CARE: Discharge to home after PACU  PATIENT DISPOSITION:  PACU - hemodynamically stable.

## 2018-04-14 NOTE — Transfer of Care (Signed)
Immediate Anesthesia Transfer of Care Note  Patient: Phyllis [name]  Procedure(s) Performed: CYSTOSCOPY WITH RETROGRADE PYELOGRAM (Bilateral )  Patient Location: PACU  Anesthesia Type:General  Level of Consciousness: awake, alert  and patient cooperative  Airway & Oxygen Therapy: Patient Spontanous Breathing and Patient connected to face mask oxygen  Post-op Assessment: Report given to RN and Post -op Vital signs reviewed and stable  Post vital signs: Reviewed and stable  Last Vitals:  Vitals Value Taken Time  BP 103/73 04/14/2018 10:09 AM  Temp    Pulse 65 04/14/2018 10:11 AM  Resp 15 04/14/2018 10:11 AM  SpO2 100 % 04/14/2018 10:11 AM  Vitals shown include unvalidated device data.  Last Pain:  Vitals:   04/14/18 0846  TempSrc:   PainSc: 0-No pain      Patients Stated Pain Goal: 3 (04/14/18 0846)  Complications: No apparent anesthesia complications

## 2018-04-14 NOTE — Anesthesia Preprocedure Evaluation (Signed)
Anesthesia Evaluation  Patient identified by MRN, date of birth, ID band Patient awake    Reviewed: Allergy & Precautions, H&P , NPO status , Patient's Chart, lab work & pertinent test results, reviewed documented beta blocker date and time   Airway Mallampati: II  TM Distance: >3 FB Neck ROM: full    Dental no notable dental hx.    Pulmonary neg pulmonary ROS,    Pulmonary exam normal breath sounds clear to auscultation       Cardiovascular Exercise Tolerance: Good negative cardio ROS   Rhythm:regular Rate:Normal     Neuro/Psych negative neurological ROS  negative psych ROS   GI/Hepatic negative GI ROS, Neg liver ROS,   Endo/Other  negative endocrine ROS  Renal/GU negative Renal ROS  negative genitourinary   Musculoskeletal   Abdominal   Peds  Hematology negative hematology ROS (+)   Anesthesia Other Findings   Reproductive/Obstetrics negative OB ROS                             Anesthesia Physical  Anesthesia Plan  ASA: II  Anesthesia Plan: General   Post-op Pain Management:    Induction: Intravenous  PONV Risk Score and Plan: 3 and Ondansetron, Dexamethasone and Treatment may vary due to age or medical condition  Airway Management Planned: LMA and Oral ETT  Additional Equipment:   Intra-op Plan:   Post-operative Plan: Extubation in OR  Informed Consent: I have reviewed the patients History and Physical, chart, labs and discussed the procedure including the risks, benefits and alternatives for the proposed anesthesia with the patient or authorized representative who has indicated his/her understanding and acceptance.   Dental Advisory Given  Plan Discussed with: CRNA, Anesthesiologist and Surgeon  Anesthesia Plan Comments: ( )        Anesthesia Quick Evaluation

## 2018-04-14 NOTE — Anesthesia Preprocedure Evaluation (Deleted)
Anesthesia Evaluation  Patient identified by MRN, date of birth, ID band Patient awake    Reviewed: Allergy & Precautions, H&P , NPO status , Patient's Chart, lab work & pertinent test results, reviewed documented beta blocker date and time   Airway Mallampati: II  TM Distance: >3 FB Neck ROM: full    Dental no notable dental hx.    Pulmonary neg pulmonary ROS,    Pulmonary exam normal breath sounds clear to auscultation       Cardiovascular Exercise Tolerance: Good negative cardio ROS   Rhythm:regular Rate:Normal     Neuro/Psych negative neurological ROS  negative psych ROS   GI/Hepatic negative GI ROS, Neg liver ROS,   Endo/Other  negative endocrine ROS  Renal/GU negative Renal ROS  negative genitourinary   Musculoskeletal   Abdominal   Peds  Hematology negative hematology ROS (+)   Anesthesia Other Findings   Reproductive/Obstetrics negative OB ROS                             Anesthesia Physical  Anesthesia Plan  ASA: II  Anesthesia Plan: General   Post-op Pain Management:    Induction: Intravenous  PONV Risk Score and Plan: 3 and Ondansetron, Dexamethasone and Treatment may vary due to age or medical condition  Airway Management Planned: LMA and Oral ETT  Additional Equipment:   Intra-op Plan:   Post-operative Plan: Extubation in OR  Informed Consent: I have reviewed the patients History and Physical, chart, labs and discussed the procedure including the risks, benefits and alternatives for the proposed anesthesia with the patient or authorized representative who has indicated his/her understanding and acceptance.   Dental Advisory Given  Plan Discussed with: CRNA, Anesthesiologist and Surgeon  Anesthesia Plan Comments: ( )        Anesthesia Quick Evaluation  

## 2018-04-15 ENCOUNTER — Encounter (HOSPITAL_COMMUNITY): Payer: Self-pay | Admitting: Urology

## 2018-12-01 ENCOUNTER — Other Ambulatory Visit: Payer: Self-pay | Admitting: Obstetrics and Gynecology

## 2018-12-01 DIAGNOSIS — N6320 Unspecified lump in the left breast, unspecified quadrant: Secondary | ICD-10-CM

## 2018-12-09 ENCOUNTER — Ambulatory Visit
Admission: RE | Admit: 2018-12-09 | Discharge: 2018-12-09 | Disposition: A | Discharge status: Home / Self Care | Payer: 59 | Source: Ambulatory Visit | Attending: Obstetrics and Gynecology | Admitting: Obstetrics and Gynecology

## 2018-12-09 ENCOUNTER — Other Ambulatory Visit: Payer: 59

## 2018-12-09 DIAGNOSIS — N6320 Unspecified lump in the left breast, unspecified quadrant: Secondary | ICD-10-CM

## 2020-07-25 ENCOUNTER — Other Ambulatory Visit: Payer: Self-pay | Admitting: Obstetrics and Gynecology

## 2020-07-25 DIAGNOSIS — N6320 Unspecified lump in the left breast, unspecified quadrant: Secondary | ICD-10-CM

## 2020-08-09 ENCOUNTER — Ambulatory Visit
Admission: RE | Admit: 2020-08-09 | Discharge: 2020-08-09 | Disposition: A | Discharge status: Home / Self Care | Payer: 59 | Source: Ambulatory Visit | Attending: Obstetrics and Gynecology | Admitting: Obstetrics and Gynecology

## 2020-08-09 ENCOUNTER — Other Ambulatory Visit: Payer: Self-pay

## 2020-08-09 DIAGNOSIS — N6320 Unspecified lump in the left breast, unspecified quadrant: Secondary | ICD-10-CM

## 2021-02-10 IMAGING — MG DIGITAL DIAGNOSTIC BILAT W/ TOMO W/ CAD
8 series · 9 of 24 positions shown · non-contrast
Comparison: Previous exam(s).

CLINICAL DATA: 44-year-old female presenting for annual bilateral
mammogram and follow-up of a probably benign left breast mass.

EXAM:
DIGITAL DIAGNOSTIC BILATERAL MAMMOGRAM WITH CAD AND TOMO
ULTRASOUND LEFT BREAST

[R CC synth-2D]
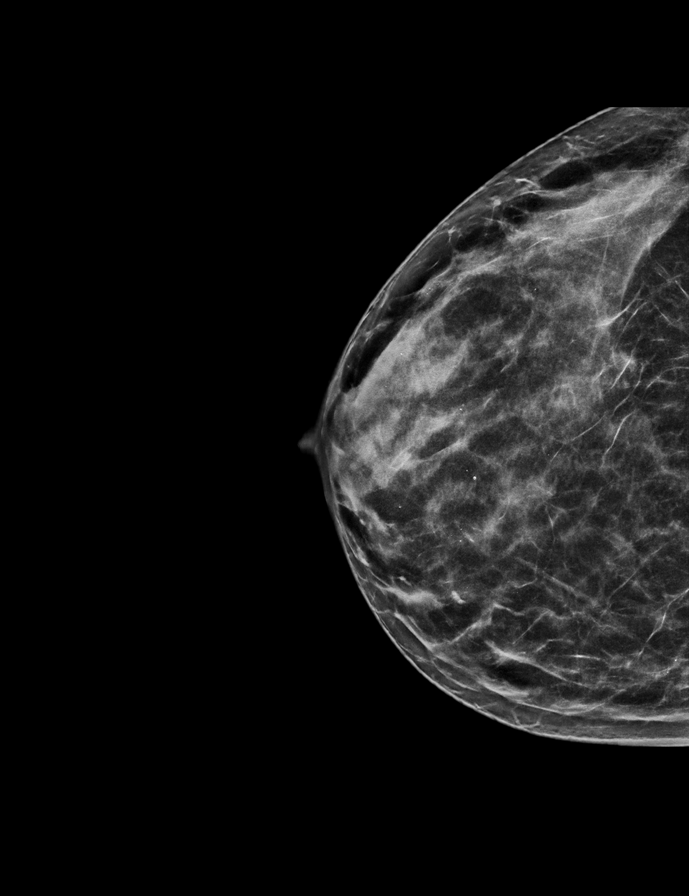

[R MLO synth-2D]
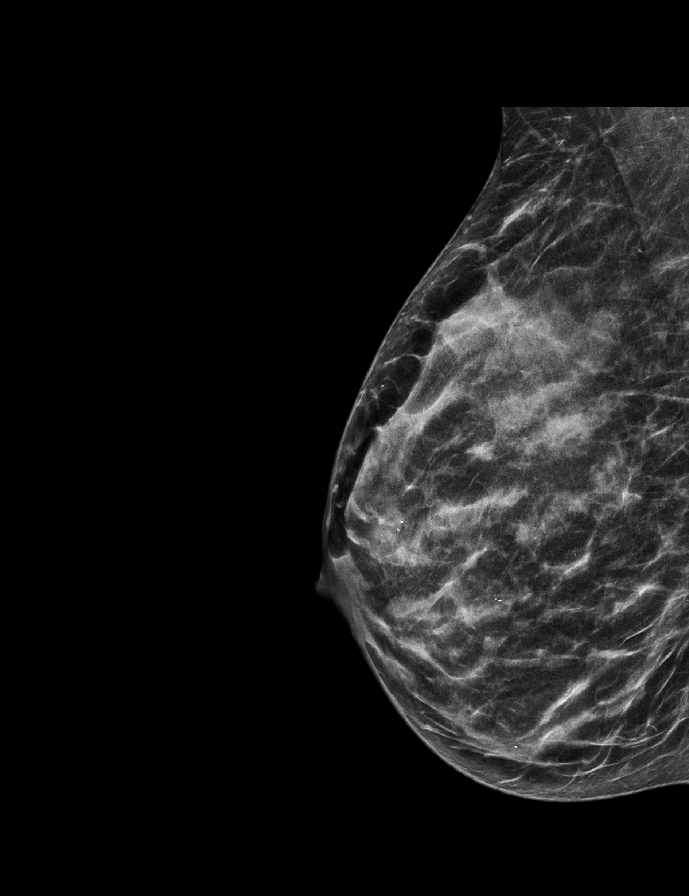

[L CC synth-2D]
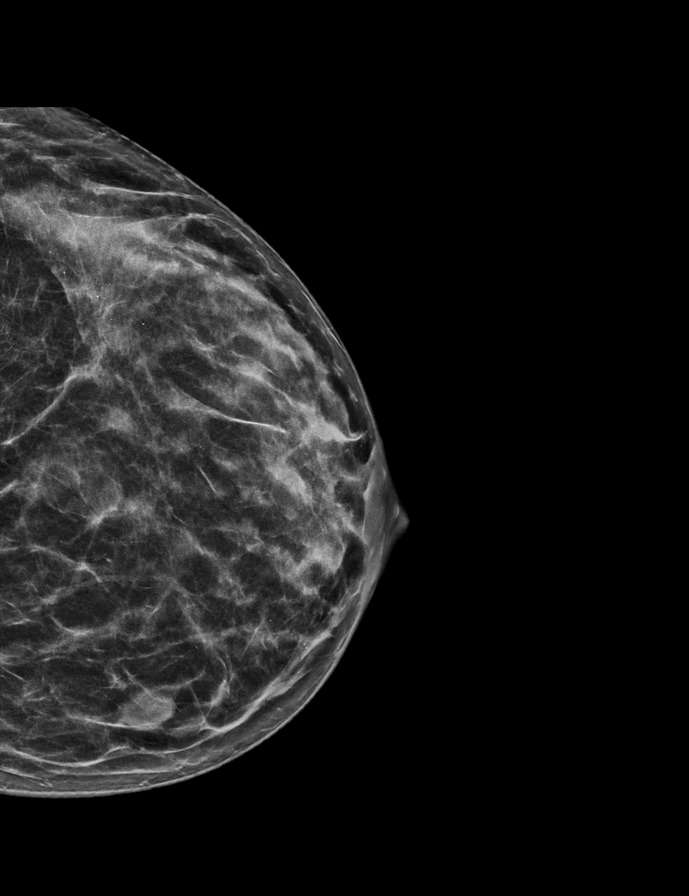

[L MLO synth-2D]
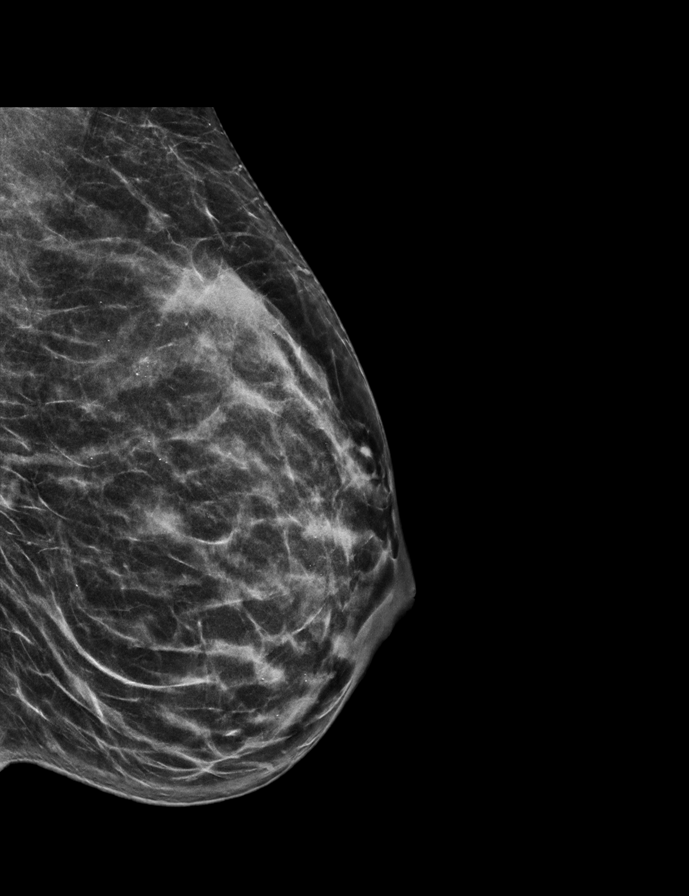

[L MLO tomo · 2 of 53 frames shown]
[frame 18/53]
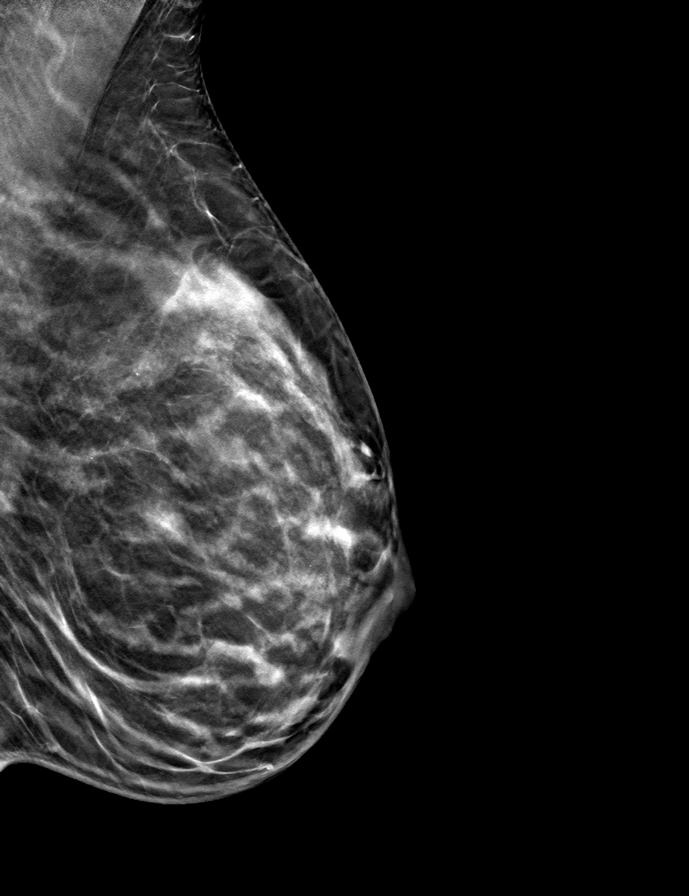
[frame 27/53]
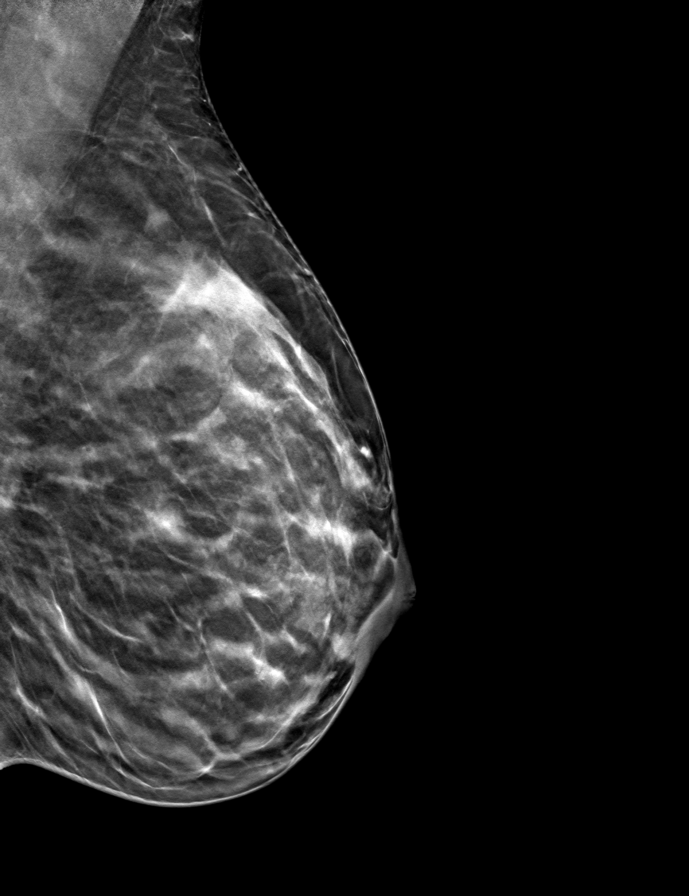

[R MLO tomo · tomo slice 27/54.0]
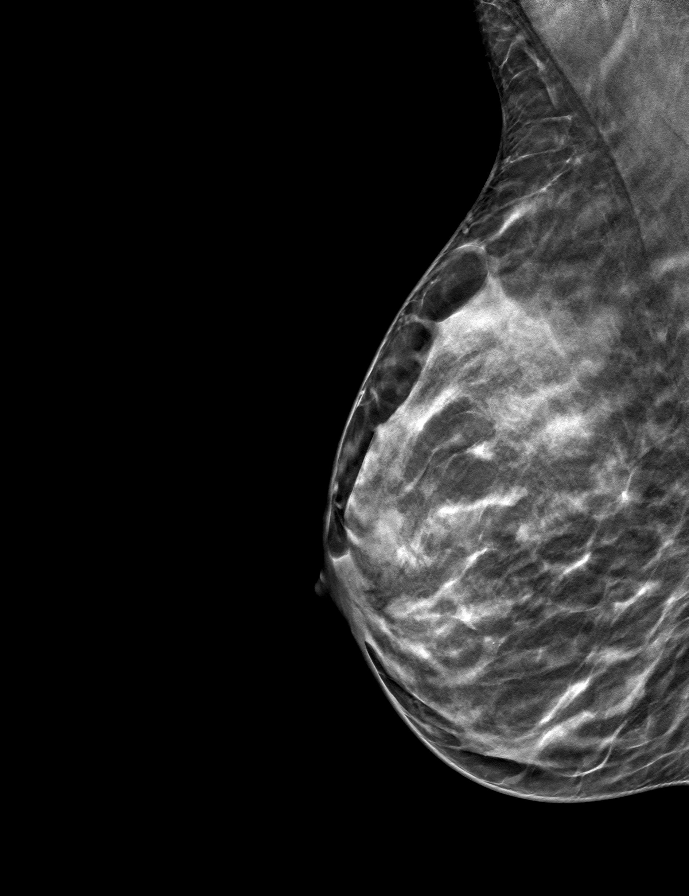

[R CC tomo · tomo slice 28/55.0]
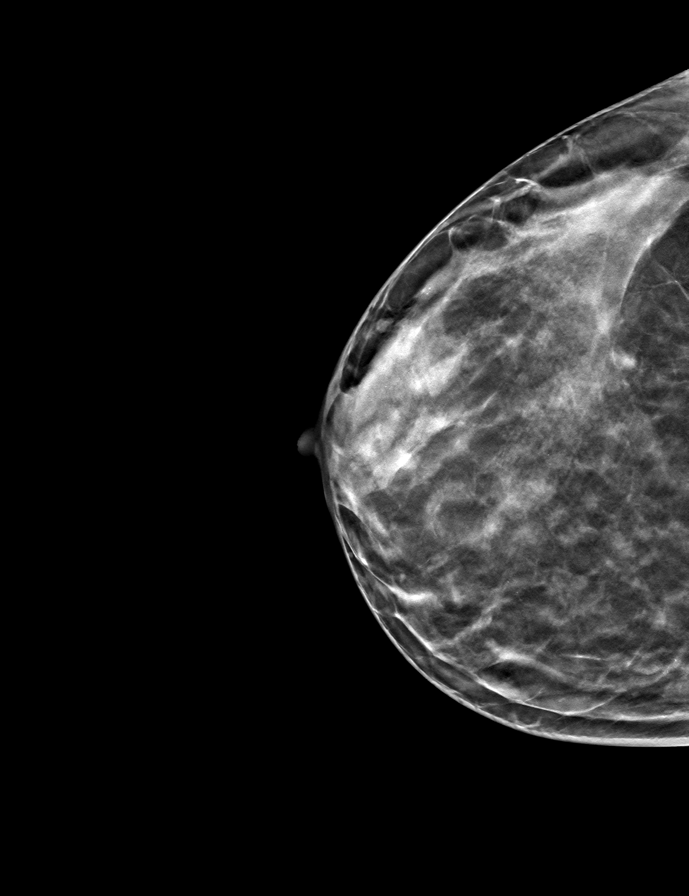

[L CC tomo · tomo slice 27/54.0]
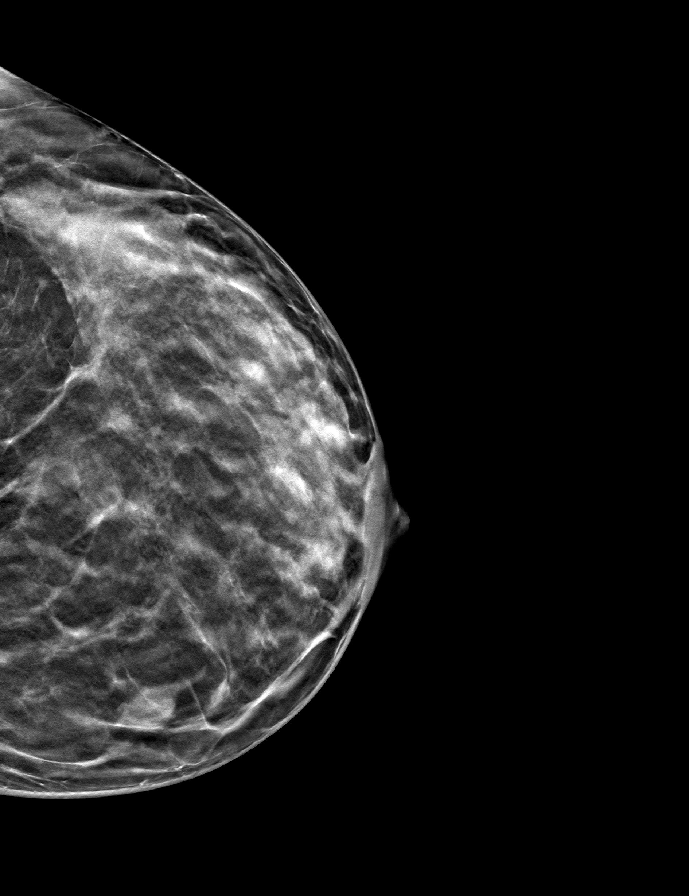

[9 of 24 positions shown; findings below may reference images not displayed]

ACR Breast Density Category c: The breast tissue is heterogeneously
dense, which may obscure small masses.
FINDINGS: A stable oval, circumscribed mass is noted in the medial left
breast. No new or suspicious mammographic findings are identified in
either breast. The parenchymal pattern is stable.

Mammographic images were processed with CAD.

Targeted ultrasound is performed, showing stable appearance of an
oval, circumscribed hypoechoic mass at the [DATE] position 4 cm from
the nipple on the left. It measures 1.3 x 1.0 x 0.6 cm (previously
1.3 x 1.1 x 0.7 cm).
IMPRESSION: 1. No mammographic evidence of malignancy in either breast.
2. Benign left breast mass demonstrating greater than 2 year
stability. No further imaging follow-up required.

RECOMMENDATION:
Screening mammogram in one year.(Code:[id number])

I have discussed the findings and recommendations with the patient.
If applicable, a reminder letter will be sent to the patient
regarding the next appointment.

BI-RADS CATEGORY  2: Benign.

## 2021-05-01 ENCOUNTER — Ambulatory Visit: Payer: 59 | Admitting: Family Medicine

## 2024-04-09 ENCOUNTER — Ambulatory Visit (AMBULATORY_SURGERY_CENTER): Payer: Self-pay

## 2024-04-09 VITALS — Ht 66.0 in | Wt 148.0 lb

## 2024-04-09 DIAGNOSIS — Z1211 Encounter for screening for malignant neoplasm of colon: Secondary | ICD-10-CM

## 2024-04-09 MED ORDER — NA SULFATE-K SULFATE-MG SULF 17.5-3.13-1.6 GM/177ML PO SOLN: 1.0000 | Freq: Once | ORAL | 0 refills | Status: AC

## 2024-04-09 NOTE — Progress Notes (Signed)
 Pt's name and DOB verified at the beginning of the pre-visit wit 2 identifier  Pt denies any difficulty with ambulating,sitting, laying down or rolling side to side  Pt has no issues moving head neck or swallowing  No egg or soy allergy known to patient   No issues known to pt with past sedation with any surgeries or procedures  No FH of Malignant Hyperthermia  Pt is not on home 02   Pt is not on blood thinners   Pt denies issues with constipation  Pt is not on dialysis  Pt denise any abnormal heart rhythms   Pt denies any upcoming cardiac testing  Patient's chart reviewed by Rogena Class CNRA prior to pre-visit and patient appropriate for the [facility].  Pre-visit completed and red dot placed by patient's name on their procedure day (on provider's schedule).    Visit by phone  Pt states weight is 148 lb  IInstructions reviewed. Pt given  both [facility] main # and MD on call # prior to instructions.  Pt states understanding of instructions. Instructed pt to review instructions again prior to procedure and call main # given if has questions.. Pt states they will.

## 2024-04-15 ENCOUNTER — Encounter: Payer: Self-pay | Admitting: Gastroenterology

## 2024-04-24 ENCOUNTER — Telehealth: Payer: Self-pay | Admitting: Gastroenterology

## 2024-04-24 ENCOUNTER — Encounter: Payer: Self-pay | Admitting: Gastroenterology

## 2024-04-24 ENCOUNTER — Ambulatory Visit (AMBULATORY_SURGERY_CENTER): Admitting: Gastroenterology

## 2024-04-24 VITALS — BP 105/66 | HR 75 | Temp 98.2°F | Resp 21 | Ht 66.0 in | Wt 148.0 lb

## 2024-04-24 DIAGNOSIS — Z1211 Encounter for screening for malignant neoplasm of colon: Secondary | ICD-10-CM

## 2024-04-24 MED ORDER — SODIUM CHLORIDE 0.9 % IV SOLN: 500.0000 mL | Freq: Once | INTRAVENOUS | Status: DC

## 2024-04-24 NOTE — Op Note (Signed)
 [facility] Patient Name: Phyllis [name] Procedure Date: 04/24/2024 2:32 PM MRN: [id number] Endoscopist: Ace Abu L. Dominic Friendly , MD, [id number] Age: 48 Referring MD:  Date of Birth: [birth year] Gender: Female Account #: [id number] Procedure:                Colonoscopy Indications:              Screening for colorectal malignant neoplasm, This                            is the patient's first colonoscopy Medicines:                Monitored Anesthesia Care Procedure:                Pre-Anesthesia Assessment:                           - Prior to the procedure, a History and Physical                            was performed, and patient medications and                            allergies were reviewed. The patient's tolerance of                            previous anesthesia was also reviewed. The risks                            and benefits of the procedure and the sedation                            options and risks were discussed with the patient.                            All questions were answered, and informed consent                            was obtained. Prior Anticoagulants: The patient has                            taken no anticoagulant or antiplatelet agents. ASA                            Grade Assessment: I - A normal, healthy patient.                            After reviewing the risks and benefits, the patient                            was deemed in satisfactory condition to undergo the                            procedure.  After obtaining informed consent, the colonoscope                            was passed under direct vision. Throughout the                            procedure, the patient's blood pressure, pulse, and                            oxygen saturations were monitored continuously. The                            Olympus Scope SN: [id number] was introduced through                            the anus and advanced to the the  cecum, identified                            by appendiceal orifice and ileocecal valve. The                            colonoscopy was performed without difficulty. The                            patient tolerated the procedure well. The quality                            of the bowel preparation was good. The ileocecal                            valve, appendiceal orifice, and rectum were                            photographed. The bowel preparation used was SUPREP. Scope In: 2:45:28 PM Scope Out: 3:00:05 PM Scope Withdrawal Time: 0 hours 10 minutes 38 seconds  Total Procedure Duration: 0 hours 14 minutes 37 seconds  Findings:                 The perianal and digital rectal examinations were                            normal.                           Repeat examination of right colon under NBI                            performed.                           The entire examined colon appeared normal on direct                            and retroflexion views. Complications:            No immediate  complications. Estimated Blood Loss:     Estimated blood loss: none. Impression:               - The entire examined colon is normal on direct and                            retroflexion views.                           - No specimens collected. Recommendation:           - Patient has a contact number available for                            emergencies. The signs and symptoms of potential                            delayed complications were discussed with the                            patient. Return to normal activities tomorrow.                            Written discharge instructions were provided to the                            patient.                           - Resume previous diet.                           - Continue present medications.                           - Repeat colonoscopy in 10 years for screening                            purposes. Maikel Neisler L. Dominic Friendly, MD 04/24/2024  3:03:37 PM This report has been signed electronically.

## 2024-04-24 NOTE — Patient Instructions (Signed)
 YOU HAD AN ENDOSCOPIC PROCEDURE TODAY AT THE [facility]:   Refer to the procedure report that was given to you for any specific questions about what was found during the examination.  If the procedure report does not answer your questions, please call your gastroenterologist to clarify.  If you requested that your care partner not be given the details of your procedure findings, then the procedure report has been included in a sealed envelope for you to review at your convenience later.  Thank you for letting us  take care of your healthcare needs today.    YOU SHOULD EXPECT: Some feelings of bloating in the abdomen. Passage of more gas than usual.  Walking can help get rid of the air that was put into your GI tract during the procedure and reduce the bloating. If you had a lower endoscopy (such as a colonoscopy or flexible sigmoidoscopy) you may notice spotting of blood in your stool or on the toilet paper. If you underwent a bowel prep for your procedure, you may not have a normal bowel movement for a few days.  Please Note:  You might notice some irritation and congestion in your nose or some drainage.  This is from the oxygen used during your procedure.  There is no need for concern and it should clear up in a day or so.  SYMPTOMS TO REPORT IMMEDIATELY:  Following lower endoscopy (colonoscopy or flexible sigmoidoscopy):  Excessive amounts of blood in the stool  Significant tenderness or worsening of abdominal pains  Swelling of the abdomen that is new, acute  Fever of 100F or higher  For urgent or emergent issues, a gastroenterologist can be reached at any hour by calling [phone]. Do not use MyChart messaging for urgent concerns.    DIET:  We do recommend a small meal at first, but then you may proceed to your regular diet.  Drink plenty of fluids but you should avoid alcoholic beverages for 24 hours.  ACTIVITY:  You should plan to take it easy for the rest of today  and you should NOT DRIVE or use heavy machinery until tomorrow (because of the sedation medicines used during the test).    FOLLOW UP: Our staff will call the number listed on your records the next business day following your procedure.  We will call around 7:15- 8:00 am to check on you and address any questions or concerns that you may have regarding the information given to you following your procedure. If we do not reach you, we will leave a message.     If any biopsies were taken you will be contacted by phone or by letter within the next 1-3 weeks.  Please call us  at [phone] if you have not heard about the biopsies in 3 weeks.    SIGNATURES/CONFIDENTIALITY: You and/or your care partner have signed paperwork which will be entered into your electronic medical record.  These signatures attest to the fact that that the information above on your After Visit Summary has been reviewed and is understood.  Full responsibility of the confidentiality of this discharge information lies with you and/or your care-partner.

## 2024-04-24 NOTE — Progress Notes (Signed)
 Report to PACU, RN, vss, BBS= Clear.

## 2024-04-24 NOTE — Telephone Encounter (Addendum)
 Phoned patient back. She reports feeling sick for 5 hrs after drinking thew colon prep yesterday. She also reports vomiting multiple times. Stool is liquid brown after completed the first round of prep.  She has Zofran  4 mg  available at home. Advised patient to take dose of Zofran  now,and wait about 30 min before beginning the colon prep. Advised patient to take second Zofran  tablet  1-2 hrs later. Encourage patient to take the colon prep as instructed and call if she develops problems or has any additional questions.

## 2024-04-24 NOTE — Progress Notes (Signed)
 History and Physical:  This patient presents for endoscopic testing for: Encounter Diagnosis  Name Primary?   Special screening for malignant neoplasms, colon Yes    Average risk for colorectal cancer.  1st screening exam.  Patient denies chronic abdominal pain, rectal bleeding, constipation or diarrhea.   Patient is otherwise without complaints or active issues today.   Past Medical History: Past Medical History:  Diagnosis Date   Chronic kidney disease    Kidney stones     Past Surgical History: Past Surgical History:  Procedure Laterality Date   BREAST EXCISIONAL BIOPSY     CERVICAL ABLATION     CYSTOSCOPY W/ RETROGRADES Bilateral 04/14/2018   Procedure: CYSTOSCOPY WITH RETROGRADE PYELOGRAM;  Surgeon: Ottelin, Mark, MD;  Location: [facility];  Service: Urology;  Laterality: Bilateral;    Allergies: Allergies  Allergen Reactions   Other Nausea And Vomiting    Sea Food allergies    Outpatient Meds: No current outpatient medications on file.   Current Facility-Administered Medications  Medication Dose Route Frequency Provider Last Rate Last Admin   0.9 %  sodium chloride  infusion  500 mL Intravenous Once Danis, Liviah Cake L III, MD          ___________________________________________________________________ Objective   Exam:  BP 130/78   Pulse 79   Temp 98.2 F (36.8 C)   Resp 18   Ht 5' 6 (1.676 m)   Wt 148 lb (67.1 kg)   SpO2 100%   BMI 23.89 kg/m   CV: regular , S1/S2 Resp: clear to auscultation bilaterally, normal RR and effort noted GI: soft, no tenderness, with active bowel sounds.   Assessment: Encounter Diagnosis  Name Primary?   Special screening for malignant neoplasms, colon Yes     Plan: Colonoscopy   The benefits and risks of the planned procedure(s) were described in detail with the patient or (when appropriate) their health care proxy.  Risks were outlined as including, but not limited to, bleeding, infection, perforation, adverse  medication reaction leading to cardiac or pulmonary decompensation, pancreatitis (if ERCP).  The limitation of incomplete mucosal visualization was also discussed.  No guarantees or warranties were given.  The patient is appropriate for an endoscopic procedure in the ambulatory setting.   - Lorella Roles, MD

## 2024-04-24 NOTE — Progress Notes (Signed)
 Pt's states no medical or surgical changes since previsit or office visit.

## 2024-04-24 NOTE — Telephone Encounter (Signed)
 PT is scheduled for a colonoscopy today at 130 with Dr. Dominic Friendly and she called to advise that the prep medication made her sick last night and she is afraid the same thing will happen this morning when she starts the prep again. She would like to know what she should do at this point. Please advise.

## 2024-04-27 ENCOUNTER — Telehealth: Payer: Self-pay

## 2024-04-27 NOTE — Telephone Encounter (Signed)
  Follow up Call-     04/24/2024    2:23 PM  Call back number  Post procedure Call Back phone  # [phone]  Permission to leave phone message Yes    Attempted to call patient regarding follow-up. No answer, VM left.
